# Patient Record
Sex: Female | Born: 1965 | Race: White | Hispanic: No | Marital: Single | Smoking: Former smoker
Health system: Southern US, Community
[De-identification: ages and names within clinical notes are randomized; demographics above are authoritative.]

## PROBLEM LIST (undated history)

## (undated) DIAGNOSIS — E669 Obesity, unspecified: Secondary | ICD-10-CM

## (undated) HISTORY — PX: WISDOM TOOTH EXTRACTION: SHX21

## (undated) HISTORY — DX: Obesity, unspecified: E66.9

---

## 1993-10-22 HISTORY — PX: BREAST BIOPSY: SHX20

## 2008-05-05 ENCOUNTER — Ambulatory Visit: Payer: Self-pay | Admitting: Obstetrics & Gynecology

## 2008-05-07 ENCOUNTER — Ambulatory Visit: Payer: Self-pay | Admitting: Obstetrics & Gynecology

## 2008-05-19 ENCOUNTER — Ambulatory Visit: Payer: Self-pay | Admitting: Obstetrics & Gynecology

## 2008-06-02 ENCOUNTER — Ambulatory Visit: Payer: Self-pay | Admitting: Obstetrics & Gynecology

## 2008-06-16 ENCOUNTER — Ambulatory Visit: Payer: Self-pay | Admitting: Obstetrics & Gynecology

## 2008-06-22 ENCOUNTER — Ambulatory Visit (HOSPITAL_COMMUNITY): Admission: RE | Admit: 2008-06-22 | Discharge: 2008-06-22 | Payer: Self-pay | Admitting: Obstetrics & Gynecology

## 2008-06-30 ENCOUNTER — Ambulatory Visit: Payer: Self-pay | Admitting: Obstetrics & Gynecology

## 2008-07-07 ENCOUNTER — Ambulatory Visit: Payer: Self-pay | Admitting: Obstetrics & Gynecology

## 2008-07-14 ENCOUNTER — Ambulatory Visit: Payer: Self-pay | Admitting: Obstetrics & Gynecology

## 2008-07-21 ENCOUNTER — Ambulatory Visit: Payer: Self-pay | Admitting: Obstetrics & Gynecology

## 2008-07-27 ENCOUNTER — Ambulatory Visit: Payer: Self-pay | Admitting: Obstetrics & Gynecology

## 2008-07-27 ENCOUNTER — Inpatient Hospital Stay (HOSPITAL_COMMUNITY): Admission: RE | Admit: 2008-07-27 | Discharge: 2008-07-30 | Payer: Self-pay | Admitting: Obstetrics & Gynecology

## 2008-08-04 ENCOUNTER — Ambulatory Visit: Payer: Self-pay | Admitting: Obstetrics & Gynecology

## 2008-09-15 ENCOUNTER — Ambulatory Visit: Payer: Self-pay | Admitting: Obstetrics & Gynecology

## 2008-10-26 ENCOUNTER — Ambulatory Visit: Payer: Self-pay | Admitting: Obstetrics & Gynecology

## 2009-01-12 ENCOUNTER — Ambulatory Visit: Payer: Self-pay | Admitting: Obstetrics & Gynecology

## 2009-03-02 ENCOUNTER — Ambulatory Visit: Payer: Self-pay | Admitting: Obstetrics & Gynecology

## 2009-03-02 ENCOUNTER — Encounter: Payer: Self-pay | Admitting: Obstetrics & Gynecology

## 2009-07-07 IMAGING — US US OB COMP +14 WK
1 series · 14 of 28 positions shown · non-contrast
Comparison: none

OBSTETRICAL ULTRASOUND:
 This ultrasound exam was performed in the [HOSPITAL] Ultrasound Department.  The OB US report was generated in the AS system, and faxed to the ordering physician.  This report is also available in [REDACTED] PACS.

[Series 1: us ob comp +14 wk · 0.37mm/px · 34 acquisitions, 14 frames shown]
[im 2/34]
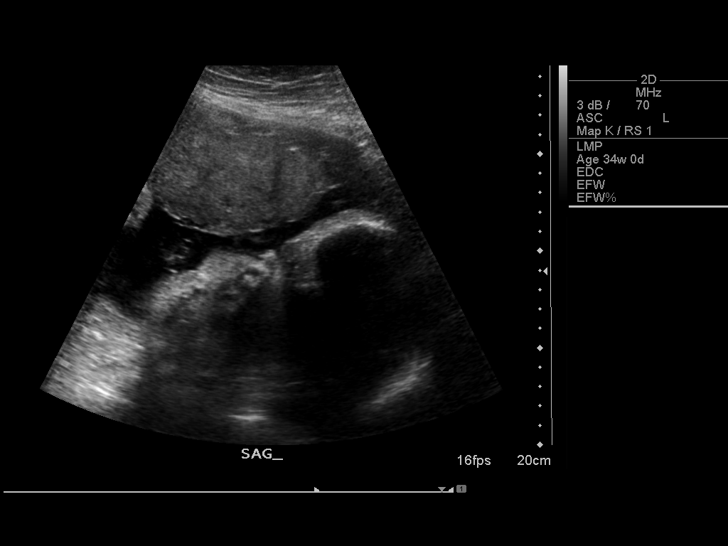
[im 4/34]
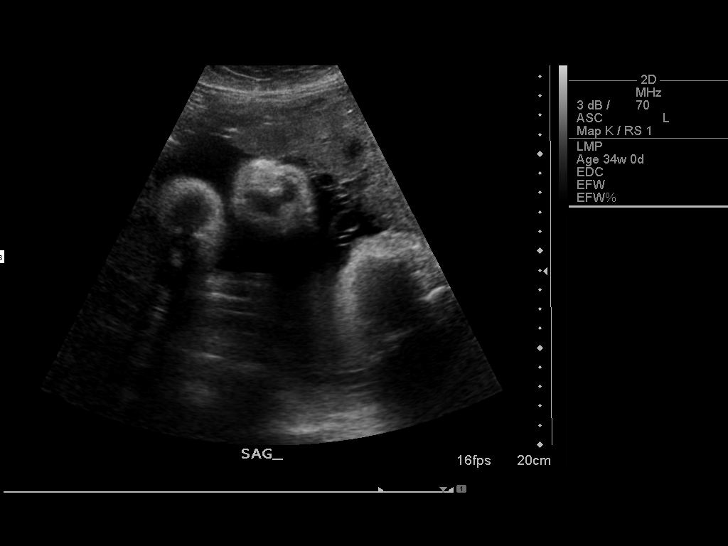
[im 7/34]
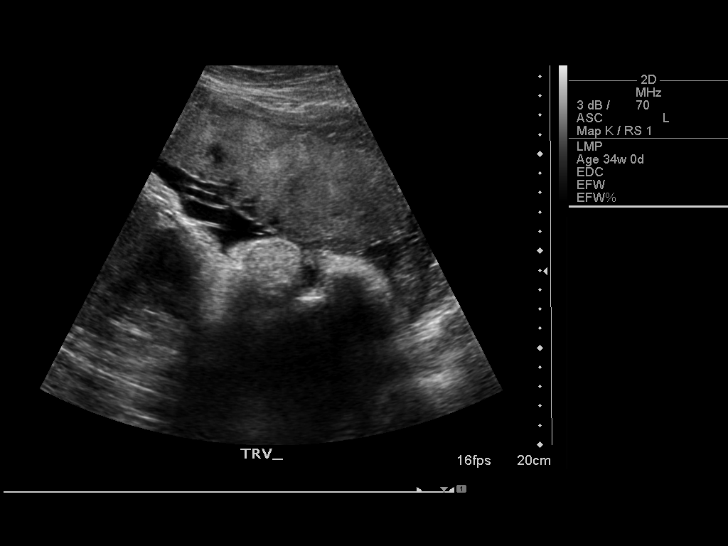
[im 9/34]
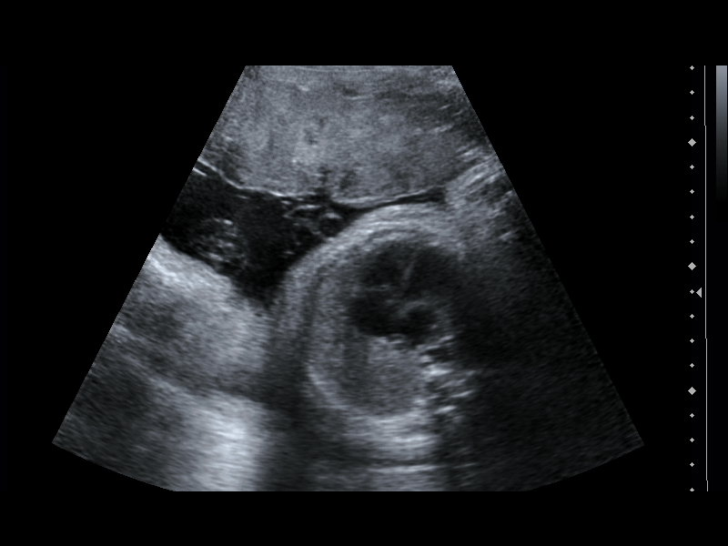
[im 12/34]
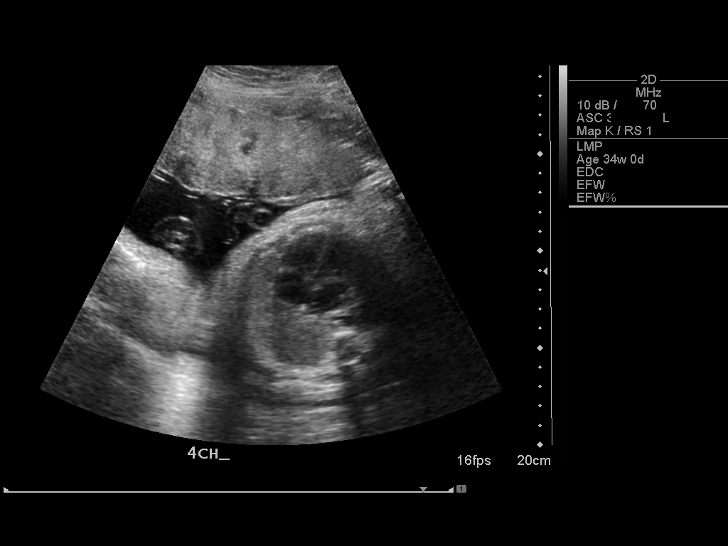
[im 14/34]
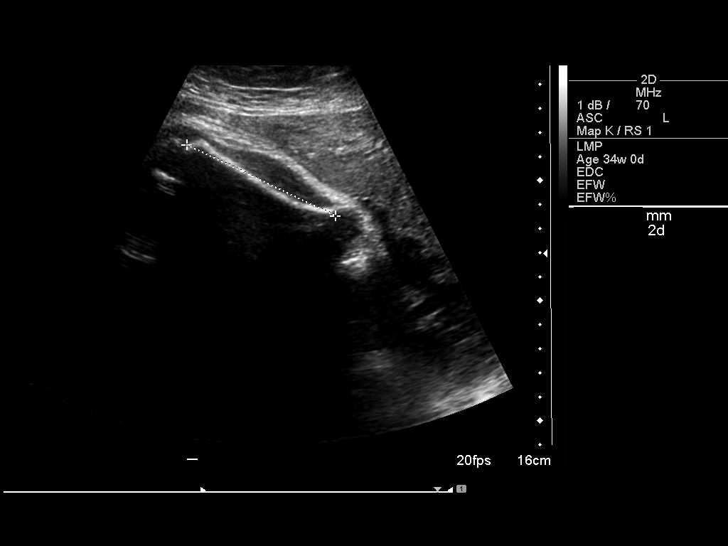
[im 16/34]
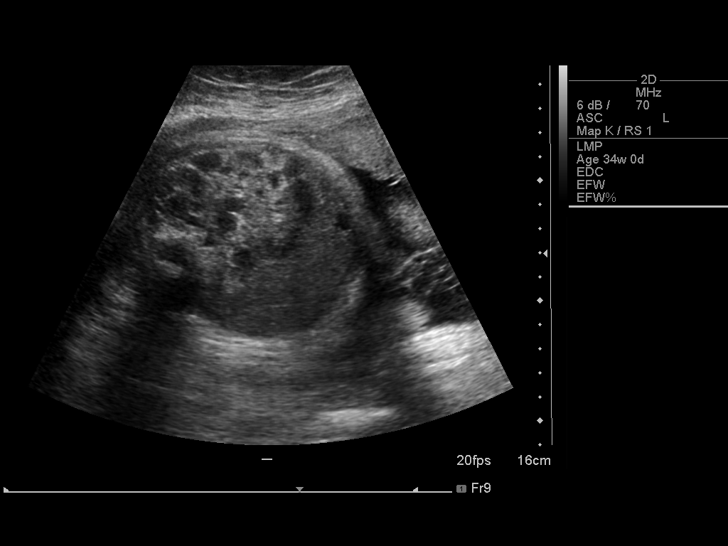
[im 19/34]
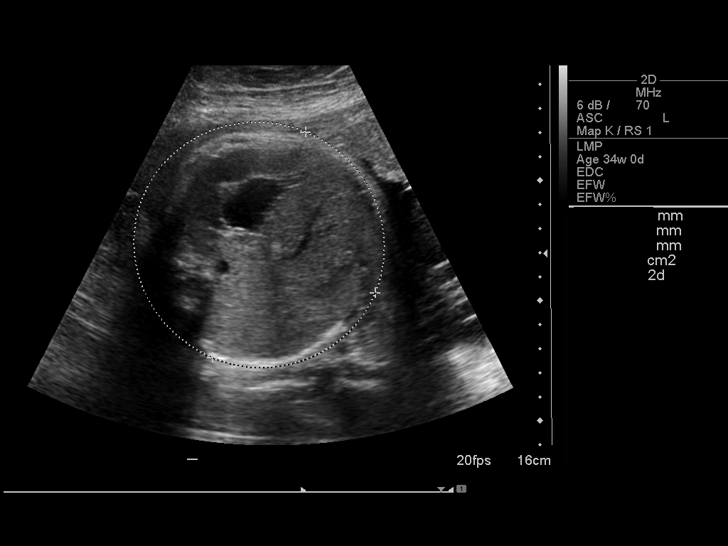
[im 21/34]
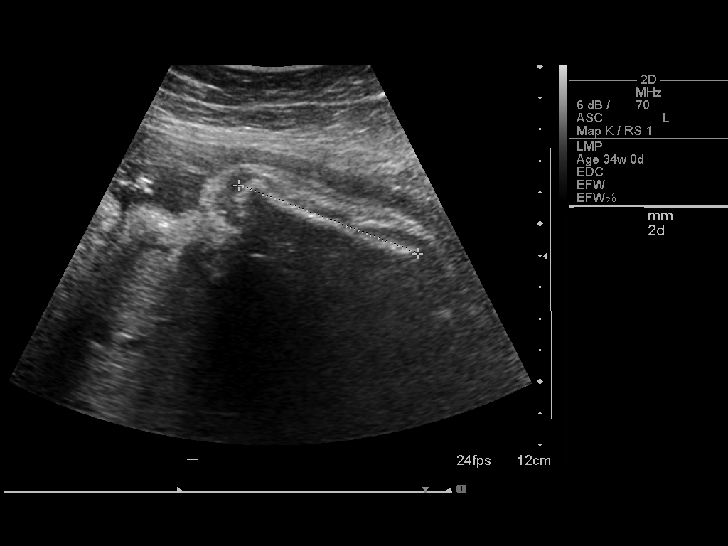
[im 24/34]
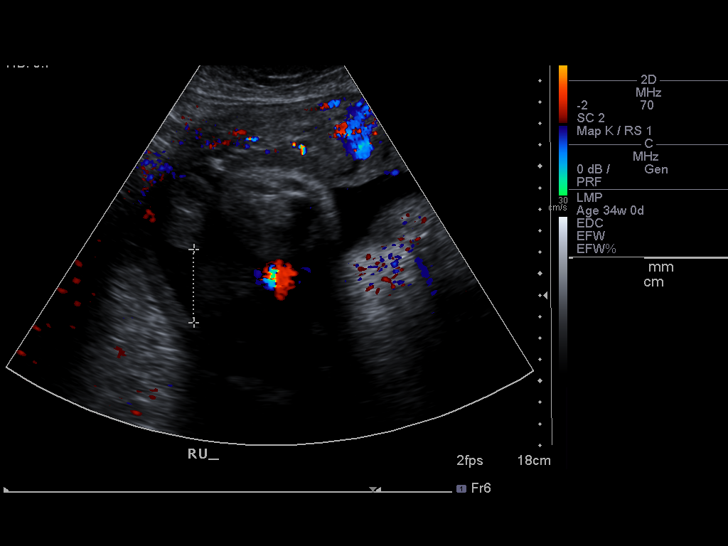
[im 26/34]
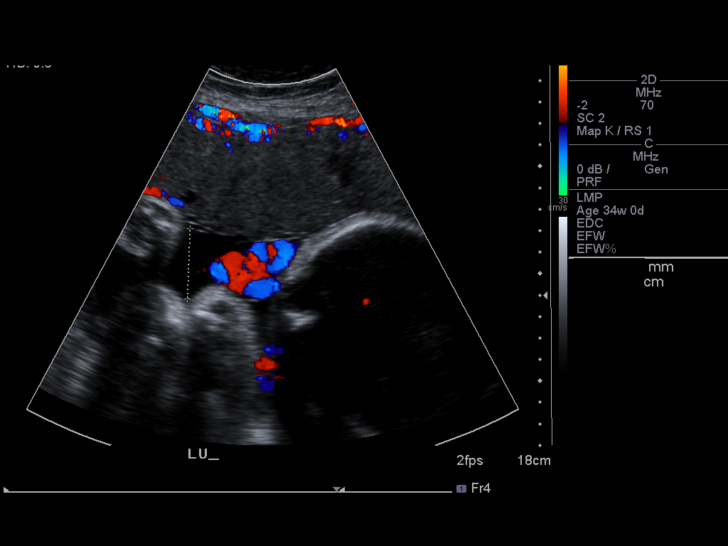
[im 29/34]
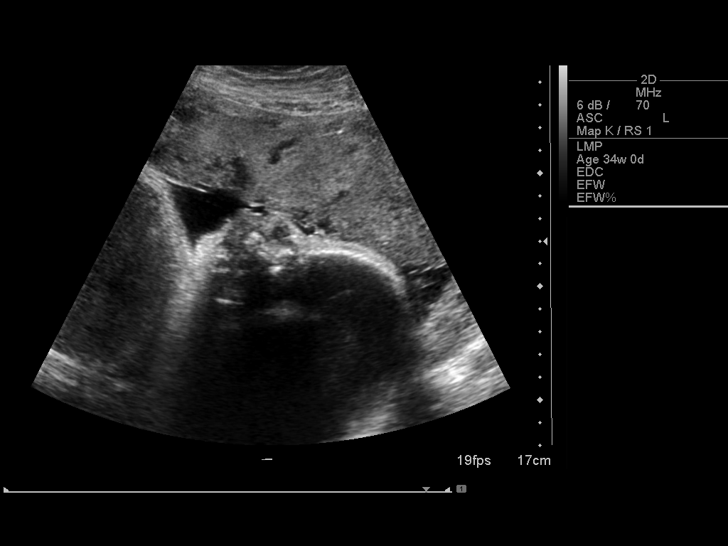
[im 31/34]
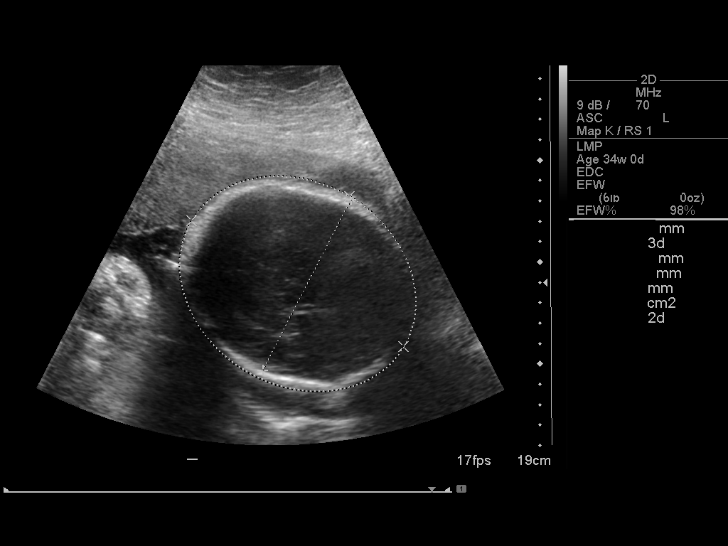
[im 34/34]
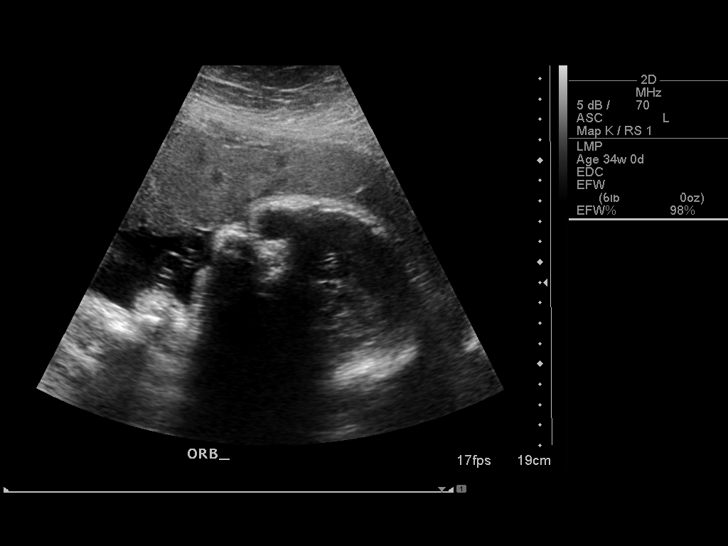

[14 of 28 positions shown; findings below may reference images not displayed]

IMPRESSION: See AS Obstetric US report.

## 2011-03-06 NOTE — Discharge Summary (Signed)
[name], [name] NO.:  [id number]   MEDICAL RECORD NO.:  [id number]         PATIENT TYPE:  WINP   LOCATION:                                FACILITY:  [facility]   PHYSICIAN:  Lesly Dukes, M.D. DATE OF BIRTH:  1966/09/16   DATE OF ADMISSION:  07/27/2008  DATE OF DISCHARGE:                               DISCHARGE SUMMARY   REASON FOR HOSPITALIZATION:  The patient presented for a repeat lower  transverse C-section at term.   PERTINENT LABORATORY DATA:  The patient is a positive rubella immune,  hemoglobin at admit was 12.1 and hematocrit 36.5.  H&H at discharge was  10.9 and 29.8.   FINAL DIAGNOSES:  Term intrauterine pregnancies, 2 repeat lower  transverse cesarean section.   SIGNIFICANT FINDINGS:  Viable female infant delivered via C-section on  July 27, 2008.  Apgars 9 and 9.  EBL 600 mL.   PROCEDURES PERFORMED:  Repeat lower transverse cesarean section.   CONDITION UPON DISCHARGE:  The patient's postpartum course was  uncomplicated now.  No complications during a postpartum term.   The patient's vital signs are stable.  Labs are within normal limits.  The patient is ambulating and tolerating p.o. fluids without difficulty.   INSTRUCTIONS:  Instructions given to the patient and family, informed to  advanced p.o. tolerated.  Instructions related to physical activity,  medications, diet, and followup care.  The patient is to avoid lifting  greater than 10 pounds.  Prescription for Percocet given #5/325, total  #30.   DIET:  Regular.   Followup care is in 1 week at [facility] for incision check.      Sid Falcon, CNM      Lesly Dukes, M.D.  Electronically Signed    WM/MEDQ  D:  07/29/2008  T:  07/29/2008  Job:  [id number]

## 2011-03-06 NOTE — Discharge Summary (Signed)
[name], [name] NO.:  [id number]   MEDICAL RECORD NO.:  [id number]         PATIENT TYPE:  WINP   LOCATION:                                [facility]   PHYSICIAN:  Lesly Dukes, M.D. DATE OF BIRTH:  1966-01-20   DATE OF ADMISSION:  07/27/2008  DATE OF DISCHARGE:                               DISCHARGE SUMMARY   REASON FOR HOSPITALIZATION:  The patient presented for a repeat lower  transverse C-section at term.   PERTINENT LABORATORY DATA:  The patient is a positive rubella immune,  hemoglobin at admit was 12.1 and hematocrit 36.5.  H&H at discharge was  10.9 and 29.8.   FINAL DIAGNOSES:  Term intrauterine pregnancies, 2 repeat lower  transverse cesarean section.   SIGNIFICANT FINDINGS:  Viable female infant delivered via C-section on  July 27, 2008.  Apgars 9 and 9.  EBL 600 mL.   PROCEDURES PERFORMED:  Repeat lower transverse cesarean section.   CONDITION UPON DISCHARGE:  The patient's postpartum course was  uncomplicated now.  No complications during a postpartum term.   The patient's vital signs are stable.  Labs are within normal limits.  The patient is ambulating and tolerating p.o. fluids without difficulty.   INSTRUCTIONS:  Instructions given to the patient and family, informed to  advanced p.o. tolerated.  Instructions related to physical activity,  medications, diet, and followup care.  The patient is to avoid lifting  greater than 10 pounds.  Prescription for Percocet given #5/325, total  #30.   DIET:  Regular.   Followup care is in 1 week at [facility] for incision check.      [name], CNM      ______________________________  Lesly Dukes, M.D.    WM/MEDQ  D:  07/29/2008  T:  07/29/2008  Job:  [id number]

## 2011-03-06 NOTE — H&P (Signed)
NAMESULEYMA, WAFER NO.:  [id number]   MEDICAL RECORD NO.:  [id number]         PATIENT TYPE:  WINP   LOCATION:                               [facility]  [facility]   PHYSICIAN:  Allie Bossier, MD        DATE OF BIRTH:  02/09/1966   DATE OF ADMISSION:  07/21/2008  DATE OF DISCHARGE:                              HISTORY & PHYSICAL   [name] is a 45 year old married white gravida 4, para 2 at 36 weeks  estimated gestational age by LMP consistent with multiple ultrasounds.  She received the first part of her prenatal care at [facility]  and has transferred to the [facility] at 27 weeks.  She had an  abnormal 1 hour Glucola of 183 but a 3-hour test was entirely normal and  followup random glucoses have been normal.  She has gained approximately  25 pounds in this pregnancy.  She declines a tubal ligation.  This has  been mentioned to her several times.  Most recently an ultrasound was  done last month that showed the baby at greater than 90th percent for  growth but normal fluid.  Her babies have always been greater than 8  pounds.   PAST MEDICAL HISTORY:  Moderate obesity.   PAST SURGICAL HISTORY:  Two cesarean sections.   NO KNOWN DRUG ALLERGIES.   SOCIAL HISTORY:  Negative for tobacco, alcohol or drug use.   NO LATEX ALLERGY.   FAMILY HISTORY:  Noncontributory.   PHYSICAL EXAM:  Weights 225 pounds, blood pressure 130/80, fetal heart  rate reassuring.  HEENT: Normal.  HEART:  Regular rate and rhythm.  LUNGS:  Clear to auscultation bilaterally.  ABDOMEN:  Benign, gravid.  Estimated fetal weight 9 pounds.   ASSESSMENT AND PLAN:  Previous C-sections times 2.  Proceed with repeat.  Risks of surgery explained, understood and accepted.      Allie Bossier, MD  Electronically Signed     MCD/MEDQ  D:  07/27/2008  T:  07/27/2008  Job:  [id number]

## 2011-03-06 NOTE — Op Note (Signed)
NAMEKELDA, [name] NO.:  [id number]   MEDICAL RECORD NO.:  [id number]          PATIENT TYPE:  INP   LOCATION:                                [facility]ILITY[facility]  [facility]   PHYSICIAN:  Allie Bossier, MD        DATE OF BIRTH:  02-Dec-1965   DATE OF PROCEDURE:  DATE OF DISCHARGE:                               OPERATIVE REPORT   PREOPERATIVE DIAGNOSES:  1. Previous C-section x2.  2. Advanced maternal age.  3. Obesity.   POSTOPERATIVE DIAGNOSES:  1. Previous C-section x2.  2. Advanced maternal age.  3. Obesity.   PROCEDURE:  Repeat low-transverse cesarean section.   SURGEON:  Allie Bossier, MD   ASSISTANT:  Odie Sera, DO   ANESTHESIA:  Quillian Quince, MD spinal.   COMPLICATIONS:  None.   ESTIMATED BLOOD LOSS:  600 mL   SPECIMENS:  None.   FINDINGS:  Normal adnexa.  Normal placenta.   She is a cord blood donor.   SPECIMEN:  Cord blood.   FINDINGS:  Living female infant, Apgars 8 and 9 at one and five minutes,  weight 812.   DETAILED PROCEDURE AND FINDINGS:  The risks, benefits, and alternatives  of the surgery were explained, understood, and accepted.  Consents were  signed.  Mersadez was taken to the operating room, spinal anesthesia was  applied without complication.  She was placed in a dorsal supine  position with a left lateral tilt.  Her abdomen and vagina were prepped  and draped in usual sterile fashion.  Foley catheter was placed, which  drained clear urine throughout.  After adequate anesthesia was assured,  a transverse incision was made at the site of her previous incision.  Incision was carried down through the subcutaneous tissue to the fascia.  Fascia was scored in midline.  Fascial incision was extended  bilaterally, curved slightly upwards, the middle one-third of the rectus  muscles were separated in a transverse fashion using electrosurgical  technique.  Excellent hemostasis was maintained.  The rectus muscles  were then bluntly pulled to the  side to yield adequate room for delivery  of this large baby.  Peritoneum was entered with hemostats.  Peritoneal  incision was extended bilaterally with a Bovie taking care to avoid the  bladder.  A bladder blade was placed.  A transverse incision was made on  the poorly developed lower uterine segment.  The uterine incision was  extended with bandage scissors and curved slightly upwards.  Amniotomy  was performed with Allis clamp and large amount of clear amniotic fluid  was noted.  The baby was delivered from a vertex presentation.  Loose  nuchal cord was easily reduced around the head.  The shoulders were  delivered.  The baby's mouth and nostrils were suctioned.  The cord was  clamped and cut.  The baby was transferred to the NICU personnel for  routine care.  Weight and Apgars are listed above.  The placenta was  extracted using uterine massage and gentle traction and it was given to  the cord  blood donation personnel.  The uterus was left in situ.  The  anterior of the uterus was cleaned with a dry lap sponge.  The uterine  incision was closed with 2 layers of 0 chromic running locking suture, 2  figure-of-eight sutures were needed at the right-hand corner of the  uterine incision to yield excellent hemostasis.  The rectus muscles and  rectus fascia were inspected, no bleeding was noted.  Rectus fascia was  closed with a 0 PDS in a running nonlocking fashion.  No defects were  palpable.  The non-looped end of the PDS was buried in the suture line  to prevent future patient discomfort.  The subcutaneous tissue was  infiltrated with 30 mL of 0.5% Marcaine.  It was irrigated with a liter  of warm normal saline.  A subcuticular closure was done with 3-0 Vicryl  suture in a running nonlocking fashion.  Steri-Strips were placed across  the incision.  The instrument, sponge, and needle counts were correct.  Her Foley catheter drained clear urine throughout.  She was taken to  recovery room  in stable condition.      Allie Bossier, MD  Electronically Signed     MCD/MEDQ  D:  07/27/2008  T:  07/27/2008  Job:  [id number]

## 2011-03-06 NOTE — Assessment & Plan Note (Signed)
[name], DIVEN NO.:  [id number]   MEDICAL RECORD NO.:  [id number]          PATIENT TYPE:  POB   LOCATION:  [facility] [place]         FACILITY:  [facility]   PHYSICIAN:  Allie Bossier, MD        DATE OF BIRTH:  06-07-1966   DATE OF SERVICE:  03/02/2009                                  CLINIC NOTE   HISTORY OF PRESENT ILLNESS:  [name] is a 45 year old single white gravida  3, para 3 who is here for her annual exam.  She currently has a 25-month-  old son that she is breast-feeding.  She has no GYN complaints.  I  inserted a Implanon on January 12, 2009 and she has no complaints about  that.  In fact, she says that, her appetite has gone back to its normal  state since stopping the Depo-Provera (she had increased appetite with  Depo) and happily she has lost 4 pounds.   PAST MEDICAL HISTORY:  Morbid obesity.   PAST SURGICAL HISTORY:  Right breast biopsy.   SOCIAL HISTORY:  Negative for tobacco, alcohol, or drug use.   FAMILY HISTORY:  Negative for breast, GYN, and colon malignancies.   REVIEW OF SYSTEMS:  She is abstinent.  She plans to breastfeed usually  slightly more than a year.  She will call and schedule a mammogram when  she quit breastfeeding.  She has been complaining of some frontal  headaches, before the Implanon.  They are daily and Tylenol relieved  some, but she has seen Dr. Alysia Penna who is family practice doctor in [place] for this issue and she plans to return to him to discuss it.   MEDICATIONS:  She has Implanon, Tylenol, and prenatal vitamins.   ALLERGIES:  No latex allergies.  No known drug allergies.   PHYSICAL EXAMINATION:  VITAL SIGNS:  Weight 226 pounds, height 5 and 4-  1/2.  Blood pressure 122/78, pulse 86.  HEENT:  Normal.  HEART:  Regular rate and rhythm.  LUNGS:  Clear to auscultation bilaterally.  ABDOMEN:  Benign and obese.  EXTERNAL GENITALIA:  No lesions.  Cervix nulliparous.  Uterus normal  size and shape, mid plane.   Adnexa nontender.  No masses.   ASSESSMENT AND PLAN:  Annual exam.  Check Pap smear.  Recommend self-  breast and self vulvar exams.  Recommend weight loss for routine health  maintenance.  I have recommended she get a fasting lipid profile checked  when she is at her family practice doctor's (Dr. Alysia Penna) office.  She  agrees to all of this.  She will come back in a year or sooner as  necessary.      Allie Bossier, MD     MCD/MEDQ  D:  03/02/2009  T:  03/02/2009  Job:  [id number]

## 2011-07-24 LAB — CBC
HCT: 29.7 — ABNORMAL LOW
HCT: 36.5
Hemoglobin: 12.1
MCHC: 33.2
MCHC: 34.6
MCV: 94.6
MCV: 95.7
Platelets: 223
Platelets: 229
RBC: 3.82 — ABNORMAL LOW
RDW: 13.8
RDW: 14.1

## 2012-01-01 ENCOUNTER — Ambulatory Visit (INDEPENDENT_AMBULATORY_CARE_PROVIDER_SITE_OTHER): Payer: BC Managed Care – PPO | Admitting: Obstetrics & Gynecology

## 2012-01-01 ENCOUNTER — Encounter: Payer: Self-pay | Admitting: Obstetrics & Gynecology

## 2012-01-01 VITALS — BP 140/90 | HR 81 | Temp 97.7°F | Resp 20 | Ht 66.0 in | Wt 220.0 lb

## 2012-01-01 DIAGNOSIS — Z0000 Encounter for general adult medical examination without abnormal findings: Secondary | ICD-10-CM

## 2012-01-01 DIAGNOSIS — Z113 Encounter for screening for infections with a predominantly sexual mode of transmission: Secondary | ICD-10-CM

## 2012-01-01 DIAGNOSIS — Z124 Encounter for screening for malignant neoplasm of cervix: Secondary | ICD-10-CM

## 2012-01-01 DIAGNOSIS — Z01419 Encounter for gynecological examination (general) (routine) without abnormal findings: Secondary | ICD-10-CM

## 2012-01-01 NOTE — Progress Notes (Signed)
Subjective:    [name] is a 46 y.o. female who presents for an annual exam. The patient has no complaints today. GYN screening history: last pap: was normal. The patient wears seatbelts: yes. The patient participates in regular exercise: no. Has the patient ever been transfused or tattooed?: not asked. The patient reports that there is not domestic violence in her life.   Menstrual History: OB History    Grav Para Term Preterm Abortions TAB SAB Ect Mult Living   4 3 3  1  1   3       Menarche age: 28  Patient's last menstrual period was 12/18/2011.    The following portions of the patient's history were reviewed and updated as appropriate: allergies, current medications, past family history, past medical history, past social history, past surgical history and problem list.  Review of Systems Pertinent items are noted in HPI.    Objective:    BP 140/90  Pulse 81  Temp(Src) 97.7 F (36.5 C) (Oral)  Resp 20  Ht 5\' 6"  (1.676 m)  Wt 220 lb (99.791 kg)  BMI 35.51 kg/m2  LMP 12/18/2011  General Appearance:    Alert, cooperative, no distress, appears stated age  Head:    Normocephalic, without obvious abnormality, atraumatic  Eyes:    PERRL, conjunctiva/corneas clear, EOM's intact, fundi    benign, both eyes  Ears:    Normal TM's and external ear canals, both ears  Nose:   Nares normal, septum midline, mucosa normal, no drainage    or sinus tenderness  Throat:   Lips, mucosa, and tongue normal; teeth and gums normal  Neck:   Supple, symmetrical, trachea midline, no adenopathy;    thyroid:  no enlargement/tenderness/nodules; no carotid   bruit or JVD  Back:     Symmetric, no curvature, ROM normal, no CVA tenderness  Lungs:     Clear to auscultation bilaterally, respirations unlabored  Chest Wall:    No tenderness or deformity   Heart:    Regular rate and rhythm, S1 and S2 normal, no murmur, rub   or gallop  Breast Exam:    No tenderness, masses, or nipple abnormality    Abdomen:     Soft, non-tender, bowel sounds active all four quadrants,    no masses, no organomegaly  Genitalia:    Normal female without lesion, discharge or tenderness, NSSA, NT, no adnexal tenderness or masses     Extremities:   Extremities normal, atraumatic, no cyanosis or edema  Pulses:   2+ and symmetric all extremities  Skin:   Skin color, texture, turgor normal, no rashes or lesions  Lymph nodes:   Cervical, supraclavicular, and axillary nodes normal  Neurologic:   CNII-XII intact, normal strength, sensation and reflexes    throughout  .    Assessment:    Healthy female exam.    Plan:     Mammogram. Pap smear.

## 2012-02-06 ENCOUNTER — Encounter: Payer: BC Managed Care – PPO | Admitting: Obstetrics & Gynecology

## 2012-02-13 ENCOUNTER — Ambulatory Visit (INDEPENDENT_AMBULATORY_CARE_PROVIDER_SITE_OTHER): Payer: BC Managed Care – PPO | Admitting: Obstetrics & Gynecology

## 2012-02-13 ENCOUNTER — Encounter: Payer: Self-pay | Admitting: Obstetrics & Gynecology

## 2012-02-13 VITALS — BP 136/78 | HR 66 | Temp 98.6°F | Resp 16 | Ht 66.0 in | Wt 220.0 lb

## 2012-02-13 DIAGNOSIS — Z0000 Encounter for general adult medical examination without abnormal findings: Secondary | ICD-10-CM

## 2012-02-13 DIAGNOSIS — Z3046 Encounter for surveillance of implantable subdermal contraceptive: Secondary | ICD-10-CM

## 2012-02-13 DIAGNOSIS — Z30017 Encounter for initial prescription of implantable subdermal contraceptive: Secondary | ICD-10-CM

## 2012-02-13 DIAGNOSIS — IMO0001 Reserved for inherently not codable concepts without codable children: Secondary | ICD-10-CM

## 2012-02-13 DIAGNOSIS — E669 Obesity, unspecified: Secondary | ICD-10-CM

## 2012-02-13 LAB — CBC
Hemoglobin: 13 g/dL (ref 12.0–15.0)
MCHC: 33.3 g/dL (ref 30.0–36.0)
Platelets: 292 10*3/uL (ref 150–400)
RBC: 4.34 MIL/uL (ref 3.87–5.11)

## 2012-02-13 LAB — COMPLETE METABOLIC PANEL WITH GFR
ALT: 10 U/L (ref 0–35)
AST: 12 U/L (ref 0–37)
CO2: 23 mEq/L (ref 19–32)
Chloride: 110 mEq/L (ref 96–112)
GFR, Est African American: >89 mL/min
Sodium: 142 mEq/L (ref 135–145)
Total Bilirubin: 0.3 mg/dL (ref 0.3–1.2)
Total Protein: 7.1 g/dL (ref 6.0–8.3)

## 2012-02-13 LAB — LIPID PANEL
HDL: 50 mg/dL (ref >39)
LDL Cholesterol: 106 mg/dL — ABNORMAL HIGH (ref 0–99)
VLDL: 18 mg/dL (ref 0–40)

## 2012-02-13 LAB — TSH: TSH: 1.572 u[IU]/mL (ref 0.350–4.500)

## 2012-02-13 MED ORDER — ETONOGESTREL 68 MG ~~LOC~~ IMPL
68.0000 mg | DRUG_IMPLANT | Freq: Once | SUBCUTANEOUS | Status: AC
  Administered 2012-02-13: 68 mg via SUBCUTANEOUS

## 2012-02-13 NOTE — Progress Notes (Signed)
  Subjective:    Patient ID: [name], female    DOB: 03/31/1966, 46 y.o.   MRN: [id number]  HPI  Charisa is here to have her Implanon replaced by a Nexplanon. Her pap from last month was normal. She still plans to get her mammogram soon and will get fasting labs drawn today.  Review of Systems     Objective:   Physical Exam Consent was signed, time out was done. Her left arm was prepped with betadine and infiltrated with 1 % lidocaine. The Implanon was easily removed. A Nexplanon was placed. Her arm was bandaged.       Assessment & Plan:  Contraception- as above Fasting labs today.

## 2012-02-13 NOTE — Progress Notes (Signed)
Addended by: Granville Lewis on: 02/13/2012 09:50 AM   Modules accepted: Orders

## 2014-08-23 ENCOUNTER — Encounter: Payer: Self-pay | Admitting: Obstetrics & Gynecology

## 2016-01-18 ENCOUNTER — Ambulatory Visit (INDEPENDENT_AMBULATORY_CARE_PROVIDER_SITE_OTHER): Payer: Medicaid Other

## 2016-01-18 ENCOUNTER — Other Ambulatory Visit (HOSPITAL_COMMUNITY)
Admission: RE | Admit: 2016-01-18 | Discharge: 2016-01-18 | Disposition: A | Discharge status: Home / Self Care | Payer: Medicaid Other | Source: Ambulatory Visit | Attending: Obstetrics & Gynecology | Admitting: Obstetrics & Gynecology

## 2016-01-18 ENCOUNTER — Ambulatory Visit (INDEPENDENT_AMBULATORY_CARE_PROVIDER_SITE_OTHER): Payer: Medicaid Other | Admitting: Obstetrics & Gynecology

## 2016-01-18 ENCOUNTER — Encounter: Payer: Self-pay | Admitting: Obstetrics & Gynecology

## 2016-01-18 VITALS — BP 141/79 | HR 70 | Resp 16 | Ht 64.0 in | Wt 232.0 lb

## 2016-01-18 DIAGNOSIS — Z01419 Encounter for gynecological examination (general) (routine) without abnormal findings: Secondary | ICD-10-CM

## 2016-01-18 DIAGNOSIS — Z3046 Encounter for surveillance of implantable subdermal contraceptive: Secondary | ICD-10-CM | POA: Diagnosis not present

## 2016-01-18 DIAGNOSIS — Z1231 Encounter for screening mammogram for malignant neoplasm of breast: Secondary | ICD-10-CM

## 2016-01-18 DIAGNOSIS — Z1151 Encounter for screening for human papillomavirus (HPV): Secondary | ICD-10-CM | POA: Diagnosis present

## 2016-01-18 DIAGNOSIS — Z124 Encounter for screening for malignant neoplasm of cervix: Secondary | ICD-10-CM | POA: Diagnosis not present

## 2016-01-18 NOTE — Progress Notes (Signed)
Subjective:    [name] is a 50 y.o. SW P3 (15, 29, and 35 yo sons) female who presents for an annual exam. The patient has no complaints today. Her Nexplanon is 51 years old. She thinks that she might be through menopause as she has had no bleeding for the last year.The patient is not currently sexually active for months due only to time constraints.  GYN screening history: last pap: was normal. The patient wears seatbelts: yes. The patient participates in regular exercise: no. Has the patient ever been transfused or tattooed?: no. The patient reports that there is not domestic violence in her life.   Menstrual History: OB History    Gravida Para Term Preterm AB TAB SAB Ectopic Multiple Living   Menarche age: 15  No LMP recorded.    The following portions of the patient's history were reviewed and updated as appropriate: allergies, current medications, past family history, past medical history, past social history, past surgical history and problem list.  Review of Systems Pertinent items noted in HPI and remainder of comprehensive ROS otherwise negative. She sees Silvestre Moment, NP in [place]. [place] for her primary care. Owns [employer].   Objective:    BP 141/79 mmHg  Pulse 70  Resp 16  Ht  (1.626 m)  Wt 232 lb (105.235 kg)  BMI 39.80 kg/m2  General Appearance:    Alert, cooperative, no distress, appears stated age  Head:    Normocephalic, without obvious abnormality, atraumatic  Eyes:    PERRL, conjunctiva/corneas clear, EOM's intact, fundi    benign, both eyes  Ears:    Normal TM's and external ear canals, both ears  Nose:   Nares normal, septum midline, mucosa normal, no drainage    or sinus tenderness  Throat:   Lips, mucosa, and tongue normal; teeth and gums normal  Neck:   Supple, symmetrical, trachea midline, no adenopathy;    thyroid:  no enlargement/tenderness/nodules; no carotid   bruit or JVD  Back:     Symmetric, no  curvature, ROM normal, no CVA tenderness  Lungs:     Clear to auscultation bilaterally, respirations unlabored  Chest Wall:    No tenderness or deformity   Heart:    Regular rate and rhythm, S1 and S2 normal, no murmur, rub   or gallop  Breast Exam:    No tenderness, masses, or nipple abnormality  Abdomen:     Soft, non-tender, bowel sounds active all four quadrants,    no masses, no organomegaly  Genitalia:    Normal female without lesion, discharge or tenderness, NSSA, NT, mobile, no palpable adnexal masses     Extremities:   Extremities normal, atraumatic, no cyanosis or edema  Pulses:   2+ and symmetric all extremities  Skin:   Skin color, texture, turgor normal, no rashes or lesions  Lymph nodes:   Cervical, supraclavicular, and axillary nodes normal  Neurologic:   CNII-XII intact, normal strength, sensation and reflexes    Throughout    .  Consent was signed and time out was done. Her right arm was prepped with betadine after establishing the position of the Nexplanon. The area was infiltrated with 2 cc of 1% lidocaine. A small incision was made and the intact rod was easily removed. A steristrip was placed and her arm was noted to be hemostatic. It was bandaged.  She tolerated the procedure well.  Assessment:    Healthy female exam.    Plan:     Breast self exam technique reviewed and patient encouraged to perform self-exam monthly. Thin prep Pap smear.  with cotesting Mammogram today Rec screening for colon cancer and weight loss

## 2016-01-19 LAB — CYTOLOGY - PAP

## 2016-01-19 LAB — FOLLICLE STIMULATING HORMONE: FSH: 33 m[IU]/mL
